# Patient Record
Sex: Male | Born: 1970 | Race: White | Hispanic: No | Marital: Married | Smoking: Never smoker
Health system: Southern US, Community
[De-identification: ages and names within clinical notes are randomized; demographics above are authoritative.]

## PROBLEM LIST (undated history)

## (undated) DIAGNOSIS — I10 Essential (primary) hypertension: Secondary | ICD-10-CM

## (undated) HISTORY — DX: Essential (primary) hypertension: I10

---

## 2021-05-16 ENCOUNTER — Encounter: Payer: Self-pay | Admitting: Internal Medicine

## 2021-05-16 ENCOUNTER — Other Ambulatory Visit: Payer: Self-pay

## 2021-05-16 ENCOUNTER — Ambulatory Visit: Payer: Commercial Managed Care - PPO | Admitting: Internal Medicine

## 2021-05-16 VITALS — BP 108/74 | HR 60 | Temp 98.8°F | Resp 16 | Ht 71.0 in | Wt 258.0 lb

## 2021-05-16 DIAGNOSIS — R0789 Other chest pain: Secondary | ICD-10-CM | POA: Insufficient documentation

## 2021-05-16 DIAGNOSIS — I10 Essential (primary) hypertension: Secondary | ICD-10-CM | POA: Insufficient documentation

## 2021-05-16 DIAGNOSIS — M1A09X0 Idiopathic chronic gout, multiple sites, without tophus (tophi): Secondary | ICD-10-CM | POA: Diagnosis not present

## 2021-05-16 DIAGNOSIS — Z23 Encounter for immunization: Secondary | ICD-10-CM

## 2021-05-16 DIAGNOSIS — Z1159 Encounter for screening for other viral diseases: Secondary | ICD-10-CM

## 2021-05-16 DIAGNOSIS — Z0000 Encounter for general adult medical examination without abnormal findings: Secondary | ICD-10-CM | POA: Diagnosis not present

## 2021-05-16 DIAGNOSIS — K219 Gastro-esophageal reflux disease without esophagitis: Secondary | ICD-10-CM

## 2021-05-16 DIAGNOSIS — Z1211 Encounter for screening for malignant neoplasm of colon: Secondary | ICD-10-CM | POA: Insufficient documentation

## 2021-05-16 DIAGNOSIS — G4733 Obstructive sleep apnea (adult) (pediatric): Secondary | ICD-10-CM | POA: Insufficient documentation

## 2021-05-16 DIAGNOSIS — E669 Obesity, unspecified: Secondary | ICD-10-CM | POA: Insufficient documentation

## 2021-05-16 DIAGNOSIS — R9431 Abnormal electrocardiogram [ECG] [EKG]: Secondary | ICD-10-CM

## 2021-05-16 LAB — CBC WITH DIFFERENTIAL/PLATELET
Basophils Absolute: 0.1 10*3/uL (ref 0.0–0.1)
Basophils Relative: 0.7 % (ref 0.0–3.0)
Eosinophils Absolute: 0.1 10*3/uL (ref 0.0–0.7)
Eosinophils Relative: 1.8 % (ref 0.0–5.0)
HCT: 44 % (ref 39.0–52.0)
Hemoglobin: 15.2 g/dL (ref 13.0–17.0)
Lymphocytes Relative: 32.3 % (ref 12.0–46.0)
Lymphs Abs: 2.6 10*3/uL (ref 0.7–4.0)
MCHC: 34.4 g/dL (ref 30.0–36.0)
MCV: 87.2 fl (ref 78.0–100.0)
Monocytes Absolute: 0.5 10*3/uL (ref 0.1–1.0)
Monocytes Relative: 6.7 % (ref 3.0–12.0)
Neutro Abs: 4.6 10*3/uL (ref 1.4–7.7)
Neutrophils Relative %: 58.5 % (ref 43.0–77.0)
Platelets: 246 10*3/uL (ref 150.0–400.0)
RBC: 5.05 Mil/uL (ref 4.22–5.81)
RDW: 14.3 % (ref 11.5–15.5)
WBC: 7.9 10*3/uL (ref 4.0–10.5)

## 2021-05-16 LAB — HEPATIC FUNCTION PANEL
ALT: 28 U/L (ref 0–53)
AST: 23 U/L (ref 0–37)
Albumin: 4.5 g/dL (ref 3.5–5.2)
Alkaline Phosphatase: 86 U/L (ref 39–117)
Bilirubin, Direct: 0.1 mg/dL (ref 0.0–0.3)
Total Bilirubin: 0.6 mg/dL (ref 0.2–1.2)
Total Protein: 6.7 g/dL (ref 6.0–8.3)

## 2021-05-16 LAB — URINALYSIS, ROUTINE W REFLEX MICROSCOPIC
Bilirubin Urine: NEGATIVE
Hgb urine dipstick: NEGATIVE
Ketones, ur: NEGATIVE
Leukocytes,Ua: NEGATIVE
Nitrite: NEGATIVE
Specific Gravity, Urine: 1.02 (ref 1.000–1.030)
Urine Glucose: NEGATIVE
Urobilinogen, UA: 1 (ref 0.0–1.0)
pH: 6.5 (ref 5.0–8.0)

## 2021-05-16 LAB — BASIC METABOLIC PANEL
BUN: 21 mg/dL (ref 6–23)
CO2: 30 mEq/L (ref 19–32)
Calcium: 9.9 mg/dL (ref 8.4–10.5)
Chloride: 102 mEq/L (ref 96–112)
Creatinine, Ser: 1.05 mg/dL (ref 0.40–1.50)
GFR: 81.88 mL/min (ref >60.00)
Glucose, Bld: 97 mg/dL (ref 70–99)
Potassium: 4.4 mEq/L (ref 3.5–5.1)
Sodium: 140 mEq/L (ref 135–145)

## 2021-05-16 LAB — URIC ACID: Uric Acid, Serum: 5.9 mg/dL (ref 4.0–7.8)

## 2021-05-16 LAB — HEMOGLOBIN A1C: Hgb A1c MFr Bld: 5.9 % (ref 4.6–6.5)

## 2021-05-16 LAB — PSA: PSA: 0.48 ng/mL (ref 0.10–4.00)

## 2021-05-16 LAB — LIPID PANEL
Cholesterol: 128 mg/dL (ref 0–200)
HDL: 40.5 mg/dL (ref >39.00)
LDL Cholesterol: 62 mg/dL (ref 0–99)
NonHDL: 87.35
Total CHOL/HDL Ratio: 3
Triglycerides: 129 mg/dL (ref 0.0–149.0)
VLDL: 25.8 mg/dL (ref 0.0–40.0)

## 2021-05-16 LAB — BRAIN NATRIURETIC PEPTIDE: Pro B Natriuretic peptide (BNP): 21 pg/mL (ref 0.0–100.0)

## 2021-05-16 LAB — TROPONIN I (HIGH SENSITIVITY): High Sens Troponin I: 2 ng/L (ref 2–17)

## 2021-05-16 MED ORDER — BISOPROLOL FUMARATE 10 MG PO TABS
10.0000 mg | ORAL_TABLET | Freq: Every day | ORAL | 0 refills | Status: DC
Start: 2021-05-16 — End: 2021-08-06

## 2021-05-16 NOTE — Patient Instructions (Signed)
Health Maintenance, Male Adopting a healthy lifestyle and getting preventive care are important in promoting health and wellness. Ask your health care provider about: The right schedule for you to have regular tests and exams. Things you can do on your own to prevent diseases and keep yourself healthy. What should I know about diet, weight, and exercise? Eat a healthy diet  Eat a diet that includes plenty of vegetables, fruits, low-fat dairy products, and lean protein. Do not eat a lot of foods that are high in solid fats, added sugars, or sodium.  Maintain a healthy weight Body mass index (BMI) is a measurement that can be used to identify possible weight problems. It estimates body fat based on height and weight. Your health care provider can help determine your BMI and help you achieve or maintain ahealthy weight. Get regular exercise Get regular exercise. This is one of the most important things you can do for your health. Most adults should: Exercise for at least 150 minutes each week. The exercise should increase your heart rate and make you sweat (moderate-intensity exercise). Do strengthening exercises at least twice a week. This is in addition to the moderate-intensity exercise. Spend less time sitting. Even light physical activity can be beneficial. Watch cholesterol and blood lipids Have your blood tested for lipids and cholesterol at 52 years of age, then havethis test every 5 years. You may need to have your cholesterol levels checked more often if: Your lipid or cholesterol levels are high. You are older than 52 years of age. You are at high risk for heart disease. What should I know about cancer screening? Many types of cancers can be detected early and may often be prevented. Depending on your health history and family history, you may need to have cancer screening at various ages. This may include screening for: Colorectal cancer. Prostate cancer. Skin cancer. Lung  cancer. What should I know about heart disease, diabetes, and high blood pressure? Blood pressure and heart disease High blood pressure causes heart disease and increases the risk of stroke. This is more likely to develop in people who have high blood pressure readings, are of African descent, or are overweight. Talk with your health care provider about your target blood pressure readings. Have your blood pressure checked: Every 3-5 years if you are 18-39 years of age. Every year if you are 40 years old or older. If you are between the ages of 65 and 75 and are a current or former smoker, ask your health care provider if you should have a one-time screening for abdominal aortic aneurysm (AAA). Diabetes Have regular diabetes screenings. This checks your fasting blood sugar level. Have the screening done: Once every three years after age 45 if you are at a normal weight and have a low risk for diabetes. More often and at a younger age if you are overweight or have a high risk for diabetes. What should I know about preventing infection? Hepatitis B If you have a higher risk for hepatitis B, you should be screened for this virus. Talk with your health care provider to find out if you are at risk forhepatitis B infection. Hepatitis C Blood testing is recommended for: Everyone born from 1945 through 1965. Anyone with known risk factors for hepatitis C. Sexually transmitted infections (STIs) You should be screened each year for STIs, including gonorrhea and chlamydia, if: You are sexually active and are younger than 52 years of age. You are older than 52 years of age   and your health care provider tells you that you are at risk for this type of infection. Your sexual activity has changed since you were last screened, and you are at increased risk for chlamydia or gonorrhea. Ask your health care provider if you are at risk. Ask your health care provider about whether you are at high risk for HIV.  Your health care provider may recommend a prescription medicine to help prevent HIV infection. If you choose to take medicine to prevent HIV, you should first get tested for HIV. You should then be tested every 3 months for as long as you are taking the medicine. Follow these instructions at home: Lifestyle Do not use any products that contain nicotine or tobacco, such as cigarettes, e-cigarettes, and chewing tobacco. If you need help quitting, ask your health care provider. Do not use street drugs. Do not share needles. Ask your health care provider for help if you need support or information about quitting drugs. Alcohol use Do not drink alcohol if your health care provider tells you not to drink. If you drink alcohol: Limit how much you have to 0-2 drinks a day. Be aware of how much alcohol is in your drink. In the [place] one drink equals one 12 oz bottle of beer (355 mL), one 5 oz glass of wine (148 mL), or one 1 oz glass of hard liquor (44 mL). General instructions Schedule regular health, dental, and eye exams. Stay current with your vaccines. Tell your health care provider if: You often feel depressed. You have ever been abused or do not feel safe at home. Summary Adopting a healthy lifestyle and getting preventive care are important in promoting health and wellness. Follow your health care provider's instructions about healthy diet, exercising, and getting tested or screened for diseases. Follow your health care provider's instructions on monitoring your cholesterol and blood pressure. This information is not intended to replace advice given to you by your health care provider. Make sure you discuss any questions you have with your healthcare provider. Document Revised: 10/15/2018 Document Reviewed: 10/15/2018 Elsevier Patient Education  2022 [employer] Inc[employer]  

## 2021-05-16 NOTE — Progress Notes (Signed)
Subjective:  Patient ID: Kenneth Bradshaw, male    DOB: 1971-05-20  Age: 50 y.o. MRN: [id number]  CC: Annual Exam, Hypertension, and Gastroesophageal Reflux  This visit occurred during the SARS-CoV-2 public health emergency.  Safety protocols were in place, including screening questions prior to the visit, additional usage of staff PPE, and extensive cleaning of exam room while observing appropriate contact time as indicated for disinfecting solutions.    HPI Kenneth Bradshaw presents for a CPX, f/up, and to establish.  He complains of a several month history of diffuse chest tightness that he relates to GERD.  He gets symptom relief with Tums.  He denies odynophagia, dysphagia, loss of appetite, or weight loss.  He does not exercise but when he is active he does not experience CP, DOE, palpitations, edema, or fatigue.  He has a history of hypertension but has not been monitoring his blood pressure recently.   History Kenneth Bradshaw has a past medical history of HTN (hypertension).   He has no past surgical history on file.   His family history includes Arthritis in his daughter; Bell's palsy in his brother; Cancer in his paternal grandfather and paternal grandmother; Diabetes in his father and paternal grandfather; Heart disease in his mother; Hyperlipidemia in his mother; Hypertension in his mother.He reports that he has never smoked. He has never used smokeless tobacco. He reports that he does not drink alcohol and does not use drugs.  Outpatient Medications Prior to Visit  Medication Sig Dispense Refill   allopurinol (ZYLOPRIM) 300 MG tablet Take 300 mg by mouth daily.     bisoprolol-hydrochlorothiazide (ZIAC) 10-6.25 MG tablet Take 1 tablet by mouth daily.     No facility-administered medications prior to visit.    ROS Review of Systems  Constitutional: Negative.  Negative for diaphoresis, fatigue and unexpected weight change.  HENT: Negative.  Negative for sore throat and trouble swallowing.    Eyes: Negative.   Respiratory:  Positive for apnea and chest tightness. Negative for cough, shortness of breath and wheezing.   Cardiovascular:  Negative for chest pain, palpitations and leg swelling.  Gastrointestinal:  Negative for abdominal pain, blood in stool, constipation, diarrhea, nausea and vomiting.       ++ heartburn  Endocrine: Negative.   Genitourinary: Negative.  Negative for difficulty urinating, dysuria, penile swelling, scrotal swelling and urgency.  Musculoskeletal: Negative.   Skin: Negative.   Neurological:  Negative for dizziness, weakness, light-headedness and numbness.  Hematological:  Negative for adenopathy. Does not bruise/bleed easily.  Psychiatric/Behavioral: Negative.     Objective:  BP 108/74 (BP Location: Left Arm, Patient Position: Sitting, Cuff Size: Large) Comment: BP (R) 108/74 (L) 106/72  Pulse 60   Temp 98.8 F (37.1 C) (Oral)   Resp 16   Ht 5\' 11"  (1.803 m)   Wt 258 lb (117 kg)   SpO2 96%   BMI 35.98 kg/m   Physical Exam Vitals reviewed.  Constitutional:      Appearance: He is obese.  HENT:     Nose: Nose normal.     Mouth/Throat:     Mouth: Mucous membranes are moist.  Eyes:     General: No scleral icterus.    Conjunctiva/sclera: Conjunctivae normal.  Cardiovascular:     Rate and Rhythm: Regular rhythm. Bradycardia present.     Heart sounds: Normal heart sounds, S1 normal and S2 normal. No murmur heard.   No friction rub.     Comments: EKG- Sinus bradycardia, 58 bpm No LVH or Q  waves TWI/flattening in III, aVF, V1-V4 Pulmonary:     Effort: Pulmonary effort is normal.     Breath sounds: No stridor. No wheezing, rhonchi or rales.  Abdominal:     General: Abdomen is protuberant. Bowel sounds are normal. There is no distension.     Palpations: Abdomen is soft. There is no hepatomegaly, splenomegaly or mass.     Tenderness: There is no abdominal tenderness. There is no guarding.     Hernia: There is no hernia in the left inguinal  area or right inguinal area.  Genitourinary:    Pubic Area: No rash.      Penis: Normal.      Testes: Normal.     Epididymis:     Right: Normal. No mass.     Left: Normal. No mass.     Prostate: Normal. Not enlarged, not tender and no nodules present.     Rectum: Normal. Guaiac result negative. No mass, tenderness, anal fissure, external hemorrhoid or internal hemorrhoid. Normal anal tone.  Musculoskeletal:     Cervical back: Neck supple.     Right lower leg: No edema.     Left lower leg: No edema.  Lymphadenopathy:     Cervical: No cervical adenopathy.     Lower Body: No right inguinal adenopathy. No left inguinal adenopathy.  Skin:    General: Skin is warm and dry.  Neurological:     General: No focal deficit present.     Mental Status: He is alert and oriented to person, place, and time. Mental status is at baseline.  Psychiatric:        Mood and Affect: Mood normal.        Behavior: Behavior normal.    Lab Results  Component Value Date   WBC 7.9 05/16/2021   HGB 15.2 05/16/2021   HCT 44.0 05/16/2021   PLT 246.0 05/16/2021   GLUCOSE 97 05/16/2021   CHOL 128 05/16/2021   TRIG 129.0 05/16/2021   HDL 40.50 05/16/2021   LDLCALC 62 05/16/2021   ALT 28 05/16/2021   AST 23 05/16/2021   NA 140 05/16/2021   K 4.4 05/16/2021   CL 102 05/16/2021   CREATININE 1.05 05/16/2021   BUN 21 05/16/2021   CO2 30 05/16/2021   PSA 0.48 05/16/2021   HGBA1C 5.9 05/16/2021     Assessment & Plan:   [place]wood was seen today for annual exam, hypertension and gastroesophageal reflux.  Diagnoses and all orders for this visit:  Routine general medical examination at a health care facility- Exam completed, labs reviewed, vaccines reviewed, cancer screenings addressed, patient education was given. -     Lipid panel; Future -     PSA; Future -     Hepatitis C antibody; Future -     HIV Antibody (routine testing w rflx); Future -     HIV Antibody (routine testing w rflx) -     Hepatitis C  antibody -     PSA -     Lipid panel  Need for hepatitis C screening test -     Hepatitis C antibody; Future -     Hepatitis C antibody  Screen for colon cancer -     Cologuard; Future  Primary hypertension- His blood pressure is overcontrolled.  Will discontinue hydrochlorothiazide and maintain the current dose of bisoprolol.  I may taper the dose of bisoprolol over the next few months. -     EKG 12-Lead -     bisoprolol (ZEBETA)  10 MG tablet; Take 1 tablet (10 mg total) by mouth daily. -     CBC with Differential/Platelet; Future -     Basic metabolic panel; Future -     Urinalysis, Routine w reflex microscopic; Future -     Hepatic function panel; Future -     Hepatic function panel -     Urinalysis, Routine w reflex microscopic -     Basic metabolic panel -     CBC with Differential/Platelet  Chest tightness- His EKG is abnormal.  His labs are reassuring.  I have asked him to undergo a CT calcium score to gauge his risk for CAD. -     Troponin I (High Sensitivity); Future -     Brain natriuretic peptide; Future -     D-dimer, quantitative; Future -     D-dimer, quantitative -     Brain natriuretic peptide -     Troponin I (High Sensitivity) -     CT CARDIAC SCORING (SELF PAY ONLY); Future  Idiopathic chronic gout of multiple sites without tophus- He has achieved his uric acid goal. -     Uric acid; Future -     Uric acid  Gastroesophageal reflux disease without esophagitis-he is getting adequate symptom relief with Tums.  He prefers not to start a medication for this. -     CBC with Differential/Platelet; Future -     CBC with Differential/Platelet  OSA (obstructive sleep apnea)- He has decided not to use CPAP.  Obesity (BMI 30.0-34.9)- Labs are negative for secondary causes or complications related to this. -     Thyroid Panel With TSH; Future -     Hemoglobin A1c; Future -     Hepatic function panel; Future -     Hepatic function panel -     Hemoglobin A1c -      Thyroid Panel With TSH  Abnormal electrocardiogram (ECG) (EKG) -     CT CARDIAC SCORING (SELF PAY ONLY); Future  Other orders -     Tdap vaccine greater than or equal to 7yo IM -     Varicella-zoster vaccine IM (Shingrix)  I have discontinued Fayrene Fearing Grizzle's bisoprolol-hydrochlorothiazide. I am also having him start on bisoprolol. Additionally, I am having him maintain his allopurinol.  Meds ordered this encounter  Medications   bisoprolol (ZEBETA) 10 MG tablet    Sig: Take 1 tablet (10 mg total) by mouth daily.    Dispense:  90 tablet    Refill:  0      Follow-up: Return in about 3 months (around 08/16/2021).  Sanda Linger, MD

## 2021-05-17 ENCOUNTER — Encounter: Payer: Self-pay | Admitting: Internal Medicine

## 2021-05-17 LAB — D-DIMER, QUANTITATIVE: D-Dimer, Quant: <0.19 mcg/mL FEU (ref <0.50)

## 2021-05-17 LAB — HEPATITIS C ANTIBODY
Hepatitis C Ab: NONREACTIVE
SIGNAL TO CUT-OFF: 0 (ref <1.00)

## 2021-05-17 LAB — THYROID PANEL WITH TSH
Free Thyroxine Index: 2.2 (ref 1.4–3.8)
T3 Uptake: 27 % (ref 22–35)
T4, Total: 8.3 ug/dL (ref 4.9–10.5)
TSH: 2.08 mIU/L (ref 0.40–4.50)

## 2021-05-17 LAB — HIV ANTIBODY (ROUTINE TESTING W REFLEX): HIV 1&2 Ab, 4th Generation: NONREACTIVE

## 2021-05-20 NOTE — Addendum Note (Signed)
Addended by: Etta Grandchild on: 05/20/2021 10:48 AM   Modules accepted: Orders

## 2021-05-31 ENCOUNTER — Encounter: Payer: Self-pay | Admitting: Internal Medicine

## 2021-06-20 LAB — COLOGUARD: Cologuard: NEGATIVE

## 2021-08-06 ENCOUNTER — Other Ambulatory Visit: Payer: Self-pay | Admitting: Internal Medicine

## 2021-08-06 DIAGNOSIS — I10 Essential (primary) hypertension: Secondary | ICD-10-CM

## 2021-08-16 ENCOUNTER — Ambulatory Visit: Payer: Commercial Managed Care - PPO | Admitting: Internal Medicine

## 2021-08-16 ENCOUNTER — Other Ambulatory Visit: Payer: Self-pay

## 2021-08-16 ENCOUNTER — Encounter: Payer: Self-pay | Admitting: Internal Medicine

## 2021-08-16 VITALS — BP 122/74 | HR 62 | Temp 99.0°F | Ht 71.0 in | Wt 261.0 lb

## 2021-08-16 DIAGNOSIS — Z23 Encounter for immunization: Secondary | ICD-10-CM

## 2021-08-16 DIAGNOSIS — I10 Essential (primary) hypertension: Secondary | ICD-10-CM | POA: Diagnosis not present

## 2021-08-16 NOTE — Patient Instructions (Signed)

## 2021-08-16 NOTE — Progress Notes (Signed)
Subjective:  Patient ID: Kenneth Bradshaw, male    DOB: 1971-03-02  Age: 50 y.o. MRN: [id number]  CC: Hypertension  This visit occurred during the SARS-CoV-2 public health emergency.  Safety protocols were in place, including screening questions prior to the visit, additional usage of staff PPE, and extensive cleaning of exam room while observing appropriate contact time as indicated for disinfecting solutions.    HPI [name] presents for f/up -  He tells me his blood pressure has been well controlled.  He has felt well recently and offers no complaints.  Outpatient Medications Prior to Visit  Medication Sig Dispense Refill   allopurinol (ZYLOPRIM) 300 MG tablet Take 300 mg by mouth daily.     bisoprolol (ZEBETA) 10 MG tablet TAKE 1 TABLET BY MOUTH EVERY DAY 90 tablet 0   No facility-administered medications prior to visit.    ROS Review of Systems  Constitutional:  Negative for diaphoresis and fatigue.  HENT: Negative.    Eyes: Negative.   Respiratory:  Negative for cough, chest tightness, shortness of breath and wheezing.   Cardiovascular:  Negative for chest pain, palpitations and leg swelling.  Gastrointestinal:  Negative for abdominal pain, diarrhea and nausea.  Endocrine: Negative.   Genitourinary: Negative.  Negative for difficulty urinating.  Musculoskeletal: Negative.  Negative for arthralgias.  Skin: Negative.   Neurological: Negative.  Negative for dizziness, weakness and light-headedness.  Hematological:  Negative for adenopathy. Does not bruise/bleed easily.   Objective:  BP 122/74 (BP Location: Left Arm, Patient Position: Sitting, Cuff Size: Large)   Pulse 62   Temp 99 F (37.2 C) (Oral)   Ht 5\' 11"  (1.803 m)   Wt 261 lb (118.4 kg)   SpO2 96%   BMI 36.40 kg/m   BP Readings from Last 3 Encounters:  08/16/21 122/74  05/16/21 108/74    Wt Readings from Last 3 Encounters:  08/16/21 261 lb (118.4 kg)  05/16/21 258 lb (117 kg)    Physical  Exam Vitals reviewed.  HENT:     Nose: Nose normal.     Mouth/Throat:     Mouth: Mucous membranes are moist.  Eyes:     General: No scleral icterus.    Conjunctiva/sclera: Conjunctivae normal.  Cardiovascular:     Rate and Rhythm: Normal rate and regular rhythm.     Heart sounds: No murmur heard. Pulmonary:     Effort: Pulmonary effort is normal.     Breath sounds: No stridor. No wheezing, rhonchi or rales.  Abdominal:     General: Abdomen is flat.     Palpations: There is no mass.     Tenderness: There is no abdominal tenderness. There is no guarding.     Hernia: No hernia is present.  Musculoskeletal:        General: Normal range of motion.     Cervical back: Neck supple.     Right lower leg: No edema.     Left lower leg: No edema.  Skin:    General: Skin is warm and dry.  Neurological:     General: No focal deficit present.     Mental Status: He is alert.    Lab Results  Component Value Date   WBC 7.9 05/16/2021   HGB 15.2 05/16/2021   HCT 44.0 05/16/2021   PLT 246.0 05/16/2021   GLUCOSE 97 05/16/2021   CHOL 128 05/16/2021   TRIG 129.0 05/16/2021   HDL 40.50 05/16/2021   LDLCALC 62 05/16/2021   ALT 28  05/16/2021   AST 23 05/16/2021   NA 140 05/16/2021   K 4.4 05/16/2021   CL 102 05/16/2021   CREATININE 1.05 05/16/2021   BUN 21 05/16/2021   CO2 30 05/16/2021   TSH 2.08 05/16/2021   PSA 0.48 05/16/2021   HGBA1C 5.9 05/16/2021    Patient was never admitted.  Assessment & Plan:   Jaquawn was seen today for hypertension.  Diagnoses and all orders for this visit:  Primary hypertension- His blood pressure is well controlled.  Will continue the current dose of bisoprolol.  Other orders -     Varicella-zoster vaccine IM (Shingrix)  I am having Kenneth Bradshaw Every maintain his allopurinol and bisoprolol.  No orders of the defined types were placed in this encounter.    Follow-up: Return in about 6 months (around 02/14/2022).  Sanda Linger, MD

## 2021-11-03 ENCOUNTER — Other Ambulatory Visit: Payer: Self-pay | Admitting: Internal Medicine

## 2021-11-03 DIAGNOSIS — I10 Essential (primary) hypertension: Secondary | ICD-10-CM

## 2022-01-18 ENCOUNTER — Other Ambulatory Visit: Payer: Self-pay

## 2022-01-18 ENCOUNTER — Encounter: Payer: Self-pay | Admitting: Family Medicine

## 2022-01-18 ENCOUNTER — Telehealth: Payer: Commercial Managed Care - PPO | Admitting: Family Medicine

## 2022-01-18 ENCOUNTER — Telehealth (INDEPENDENT_AMBULATORY_CARE_PROVIDER_SITE_OTHER): Payer: Commercial Managed Care - PPO | Admitting: Family Medicine

## 2022-01-18 DIAGNOSIS — R051 Acute cough: Secondary | ICD-10-CM | POA: Diagnosis not present

## 2022-01-18 DIAGNOSIS — J029 Acute pharyngitis, unspecified: Secondary | ICD-10-CM | POA: Diagnosis not present

## 2022-01-18 DIAGNOSIS — U071 COVID-19: Secondary | ICD-10-CM

## 2022-01-18 MED ORDER — BENZONATATE 200 MG PO CAPS: 200.0000 mg | ORAL_CAPSULE | Freq: Two times a day (BID) | ORAL | 0 refills | Status: DC | PRN

## 2022-01-18 NOTE — Progress Notes (Signed)
? ?  Subjective:  ? ? Patient ID: Kenneth Bradshaw, male    DOB: 17-Jan-1971, 51 y.o.   MRN: [id number] ? ?HPI ?No chief complaint on file. ? ?Documentation for virtual audio and video telecommunications through [place] encounter: ? ?The patient was located at home. 2 patient identifiers used.  ?The provider was located in the office. ?The patient did consent to this visit and is aware of possible charges through their insurance for this visit. ? ?The other persons participating in this telemedicine service were his wife. ?Time spent on call was 13 minutes and in review of previous records >15 minutes total. ? ?This virtual service is not related to other E/M service within previous 7 days. ? ?Complains of a 5 day history of fever, chills, cough, nasal congestion, sore throat,  ?Cough worse at night.  No wheezing, chest tightness or congestion. ?Positive Covid test today.  ?States he is feeling significantly improved and main concern is cough. ?Denies fever currently. ? ?Denies dizziness, chest pain, palpitations, shortness of breath, abdominal pain, N/V/D. ? ? ?Taking Nyquil at night, Vics Dayquil during the day ? ? ?Does not smoke, immunocompetent.  ? ?He has had COVID vaccines including bivalent booster. ? ?Reviewed allergies, medications, past medical, surgical, family, and social history. ? ? ? ? ?Review of Systems ?Pertinent positives and negatives in the history of present illness. ? ?   ?Objective:  ? Physical Exam ?There were no vitals taken for this visit. ? ?Alert and oriented in no acute distress.  Respirations unlabored.  Speaking in complete sentences without difficulty.  Normal mood. ? ? ?   ?Assessment & Plan:  ?COVID-19 virus infection ? ?Acute pharyngitis, unspecified etiology ? ?Acute cough ? ?Positive COVID test with symptom onset 5 days ago.  Reports feeling significantly improved and declines antiviral after discussion.  Counseling on symptomatic management including adding ibuprofen as needed, salt  water gargles, throat lozenges and good hydration.  Tessalon Perles prescribed.  Continue multisymptom cold medication as needed.  Discussed quarantine guidelines. he will follow-up if worsening. ? ?

## 2022-01-29 ENCOUNTER — Other Ambulatory Visit: Payer: Self-pay | Admitting: Internal Medicine

## 2022-01-29 DIAGNOSIS — I10 Essential (primary) hypertension: Secondary | ICD-10-CM

## 2022-02-14 ENCOUNTER — Encounter: Payer: Self-pay | Admitting: Internal Medicine

## 2022-02-14 ENCOUNTER — Ambulatory Visit (INDEPENDENT_AMBULATORY_CARE_PROVIDER_SITE_OTHER): Payer: Commercial Managed Care - PPO

## 2022-02-14 ENCOUNTER — Ambulatory Visit: Payer: Commercial Managed Care - PPO | Admitting: Internal Medicine

## 2022-02-14 VITALS — BP 126/84 | HR 62 | Temp 98.4°F | Resp 16 | Ht 71.0 in | Wt 254.0 lb

## 2022-02-14 DIAGNOSIS — J22 Unspecified acute lower respiratory infection: Secondary | ICD-10-CM | POA: Diagnosis not present

## 2022-02-14 DIAGNOSIS — R058 Other specified cough: Secondary | ICD-10-CM

## 2022-02-14 DIAGNOSIS — M1A00X0 Idiopathic chronic gout, unspecified site, without tophus (tophi): Secondary | ICD-10-CM | POA: Diagnosis not present

## 2022-02-14 MED ORDER — ALLOPURINOL 300 MG PO TABS: 300.0000 mg | ORAL_TABLET | Freq: Every day | ORAL | 0 refills | Status: DC

## 2022-02-14 MED ORDER — CEFDINIR 300 MG PO CAPS: 300.0000 mg | ORAL_CAPSULE | Freq: Two times a day (BID) | ORAL | 0 refills | Status: AC

## 2022-02-14 NOTE — Patient Instructions (Signed)

## 2022-02-14 NOTE — Progress Notes (Signed)
? ?Subjective:  ?Patient ID: Kenneth Bradshaw, male    DOB: 12-10-1970  Age: 51 y.o. MRN: [id number] ? ?CC: Cough ? ? ?HPI ?Sandro Burgo presents for f/up -  ? ?He was diagnosed with COVID about 6 weeks ago. His cough is improving but persists and is intermittently productive of yellow phlegm. ? ?Outpatient Medications Prior to Visit  ?Medication Sig Dispense Refill  ? bisoprolol (ZEBETA) 10 MG tablet TAKE 1 TABLET BY MOUTH EVERY DAY 90 tablet 0  ? allopurinol (ZYLOPRIM) 300 MG tablet Take 300 mg by mouth daily.    ? benzonatate (TESSALON) 200 MG capsule Take 1 capsule (200 mg total) by mouth 2 (two) times daily as needed for cough. 20 capsule 0  ? ?No facility-administered medications prior to visit.  ? ? ?ROS ?Review of Systems  ?Constitutional:  Negative for chills, diaphoresis, fatigue and fever.  ?HENT: Negative.  Negative for sinus pressure, sore throat and trouble swallowing.   ?Respiratory:  Positive for cough. Negative for chest tightness, shortness of breath and wheezing.   ?Cardiovascular:  Negative for chest pain, palpitations and leg swelling.  ?Gastrointestinal:  Negative for abdominal pain, diarrhea and nausea.  ?Genitourinary: Negative.  Negative for penile pain and penile swelling.  ?Musculoskeletal: Negative.   ?Skin: Negative.  Negative for rash.  ?Neurological:  Negative for dizziness, weakness and headaches.  ?Hematological:  Negative for adenopathy. Does not bruise/bleed easily.  ?Psychiatric/Behavioral: Negative.    ? ?Objective:  ?BP 126/84 (BP Location: Right Arm, Patient Position: Sitting, Cuff Size: Large)   Pulse 62   Temp 98.4 ?F (36.9 ?C) (Oral)   Resp 16   Ht 5\' 11"  (1.803 m)   Wt 254 lb (115.2 kg)   SpO2 95%   BMI 35.43 kg/m?  ? ?BP Readings from Last 3 Encounters:  ?02/14/22 126/84  ?08/16/21 122/74  ?05/16/21 108/74  ? ? ?Wt Readings from Last 3 Encounters:  ?02/14/22 254 lb (115.2 kg)  ?08/16/21 261 lb (118.4 kg)  ?05/16/21 258 lb (117 kg)  ? ? ?Physical Exam ?Vitals reviewed.   ?Constitutional:   ?   Appearance: He is not ill-appearing.  ?HENT:  ?   Nose: Nose normal.  ?   Mouth/Throat:  ?   Mouth: Mucous membranes are moist.  ?Eyes:  ?   General: No scleral icterus. ?   Conjunctiva/sclera: Conjunctivae normal.  ?Cardiovascular:  ?   Rate and Rhythm: Normal rate and regular rhythm.  ?   Heart sounds: No murmur heard. ?  No friction rub. No gallop.  ?Pulmonary:  ?   Effort: Pulmonary effort is normal. No respiratory distress.  ?   Breath sounds: No stridor. No wheezing, rhonchi or rales.  ?Chest:  ?   Chest wall: No tenderness.  ?Abdominal:  ?   General: Abdomen is protuberant. Bowel sounds are normal. There is no distension.  ?   Palpations: Abdomen is soft. There is no hepatomegaly, splenomegaly or mass.  ?   Tenderness: There is no abdominal tenderness. There is no guarding.  ?Musculoskeletal:     ?   General: Normal range of motion.  ?   Cervical back: Neck supple.  ?   Right lower leg: No edema.  ?   Left lower leg: No edema.  ?Lymphadenopathy:  ?   Cervical: No cervical adenopathy.  ?Skin: ?   General: Skin is warm.  ?   Findings: No lesion or rash.  ?Neurological:  ?   General: No focal deficit present.  ?  Mental Status: He is alert.  ?Psychiatric:     ?   Mood and Affect: Mood normal.     ?   Behavior: Behavior normal.  ? ? ?Lab Results  ?Component Value Date  ? WBC 7.9 05/16/2021  ? HGB 15.2 05/16/2021  ? HCT 44.0 05/16/2021  ? PLT 246.0 05/16/2021  ? GLUCOSE 97 05/16/2021  ? CHOL 128 05/16/2021  ? TRIG 129.0 05/16/2021  ? HDL 40.50 05/16/2021  ? LDLCALC 62 05/16/2021  ? ALT 28 05/16/2021  ? AST 23 05/16/2021  ? NA 140 05/16/2021  ? K 4.4 05/16/2021  ? CL 102 05/16/2021  ? CREATININE 1.05 05/16/2021  ? BUN 21 05/16/2021  ? CO2 30 05/16/2021  ? TSH 2.08 05/16/2021  ? PSA 0.48 05/16/2021  ? HGBA1C 5.9 05/16/2021  ? ? ?DG Chest 2 View ? ?Result Date: 02/14/2022 ?CLINICAL DATA:  51 year old male with cough for 6 weeks. EXAM: CHEST - 2 VIEW COMPARISON:  None. FINDINGS: The mediastinal  contours are within normal limits. No cardiomegaly. The lungs are clear bilaterally without evidence of focal consolidation, pleural effusion, or pneumothorax. No acute osseous abnormality. IMPRESSION: No acute cardiopulmonary process. Electronically Signed   By: Marliss Coots M.D.   On: 02/14/2022 10:40    ? ?Assessment & Plan:  ? ?Karston was seen today for cough. ? ?Diagnoses and all orders for this visit: ? ?Cough productive of yellow sputum- Chest x-ray is negative for mass or infiltrate. ?-     DG Chest 2 View; Future ? ?Idiopathic chronic gout without tophus, unspecified site ?-     allopurinol (ZYLOPRIM) 300 MG tablet; Take 1 tablet (300 mg total) by mouth daily. ? ?LRTI (lower respiratory tract infection)- I am concerned there may be a bacterial cause.  I recommended that he take a 10-day course of cefdinir. ?-     cefdinir (OMNICEF) 300 MG capsule; Take 1 capsule (300 mg total) by mouth 2 (two) times daily for 10 days. ? ? ?I have discontinued Fayrene Fearing Archila's benzonatate. I have also changed his allopurinol. Additionally, I am having him start on cefdinir. Lastly, I am having him maintain his bisoprolol. ? ?Meds ordered this encounter  ?Medications  ? allopurinol (ZYLOPRIM) 300 MG tablet  ?  Sig: Take 1 tablet (300 mg total) by mouth daily.  ?  Dispense:  90 tablet  ?  Refill:  0  ? cefdinir (OMNICEF) 300 MG capsule  ?  Sig: Take 1 capsule (300 mg total) by mouth 2 (two) times daily for 10 days.  ?  Dispense:  20 capsule  ?  Refill:  0  ? ? ? ?Follow-up: Return in about 3 months (around 05/16/2022). ? ?Sanda Linger, MD ?

## 2022-05-01 ENCOUNTER — Other Ambulatory Visit: Payer: Self-pay | Admitting: Internal Medicine

## 2022-05-01 DIAGNOSIS — I10 Essential (primary) hypertension: Secondary | ICD-10-CM

## 2022-05-12 ENCOUNTER — Other Ambulatory Visit: Payer: Self-pay | Admitting: Internal Medicine

## 2022-05-12 DIAGNOSIS — I10 Essential (primary) hypertension: Secondary | ICD-10-CM

## 2022-05-14 ENCOUNTER — Other Ambulatory Visit: Payer: Self-pay | Admitting: Internal Medicine

## 2022-05-16 ENCOUNTER — Ambulatory Visit: Payer: Commercial Managed Care - PPO | Admitting: Internal Medicine

## 2022-05-16 ENCOUNTER — Encounter: Payer: Self-pay | Admitting: Internal Medicine

## 2022-05-16 VITALS — BP 118/80 | HR 56 | Temp 98.0°F | Ht 71.0 in | Wt 254.0 lb

## 2022-05-16 DIAGNOSIS — M1A09X0 Idiopathic chronic gout, multiple sites, without tophus (tophi): Secondary | ICD-10-CM | POA: Diagnosis not present

## 2022-05-16 DIAGNOSIS — Z0000 Encounter for general adult medical examination without abnormal findings: Secondary | ICD-10-CM | POA: Diagnosis not present

## 2022-05-16 DIAGNOSIS — K219 Gastro-esophageal reflux disease without esophagitis: Secondary | ICD-10-CM | POA: Diagnosis not present

## 2022-05-16 DIAGNOSIS — I10 Essential (primary) hypertension: Secondary | ICD-10-CM

## 2022-05-16 LAB — LIPID PANEL
Cholesterol: 138 mg/dL (ref 0–200)
HDL: 45.8 mg/dL (ref >39.00)
LDL Cholesterol: 66 mg/dL (ref 0–99)
NonHDL: 91.82
Total CHOL/HDL Ratio: 3
Triglycerides: 127 mg/dL (ref 0.0–149.0)
VLDL: 25.4 mg/dL (ref 0.0–40.0)

## 2022-05-16 LAB — CBC WITH DIFFERENTIAL/PLATELET
Basophils Absolute: 0.1 10*3/uL (ref 0.0–0.1)
Basophils Relative: 0.6 % (ref 0.0–3.0)
Eosinophils Absolute: 0.1 10*3/uL (ref 0.0–0.7)
Eosinophils Relative: 1.4 % (ref 0.0–5.0)
HCT: 45.4 % (ref 39.0–52.0)
Hemoglobin: 15.1 g/dL (ref 13.0–17.0)
Lymphocytes Relative: 33.7 % (ref 12.0–46.0)
Lymphs Abs: 2.9 10*3/uL (ref 0.7–4.0)
MCHC: 33.2 g/dL (ref 30.0–36.0)
MCV: 88.3 fl (ref 78.0–100.0)
Monocytes Absolute: 0.6 10*3/uL (ref 0.1–1.0)
Monocytes Relative: 6.6 % (ref 3.0–12.0)
Neutro Abs: 4.9 10*3/uL (ref 1.4–7.7)
Neutrophils Relative %: 57.7 % (ref 43.0–77.0)
Platelets: 251 10*3/uL (ref 150.0–400.0)
RBC: 5.14 Mil/uL (ref 4.22–5.81)
RDW: 14.2 % (ref 11.5–15.5)
WBC: 8.6 10*3/uL (ref 4.0–10.5)

## 2022-05-16 LAB — URINALYSIS, ROUTINE W REFLEX MICROSCOPIC
Bilirubin Urine: NEGATIVE
Hgb urine dipstick: NEGATIVE
Ketones, ur: NEGATIVE
Leukocytes,Ua: NEGATIVE
Nitrite: NEGATIVE
RBC / HPF: NONE SEEN (ref 0–?)
Specific Gravity, Urine: 1.025 (ref 1.000–1.030)
Total Protein, Urine: NEGATIVE
Urine Glucose: NEGATIVE
Urobilinogen, UA: 0.2 (ref 0.0–1.0)
pH: 6 (ref 5.0–8.0)

## 2022-05-16 LAB — PSA: PSA: 0.5 ng/mL (ref 0.10–4.00)

## 2022-05-16 LAB — BASIC METABOLIC PANEL
BUN: 17 mg/dL (ref 6–23)
CO2: 30 mEq/L (ref 19–32)
Calcium: 10.2 mg/dL (ref 8.4–10.5)
Chloride: 102 mEq/L (ref 96–112)
Creatinine, Ser: 1.08 mg/dL (ref 0.40–1.50)
GFR: 78.6 mL/min (ref >60.00)
Glucose, Bld: 93 mg/dL (ref 70–99)
Potassium: 4.5 mEq/L (ref 3.5–5.1)
Sodium: 140 mEq/L (ref 135–145)

## 2022-05-16 LAB — HEPATIC FUNCTION PANEL
ALT: 23 U/L (ref 0–53)
AST: 22 U/L (ref 0–37)
Albumin: 4.7 g/dL (ref 3.5–5.2)
Alkaline Phosphatase: 95 U/L (ref 39–117)
Bilirubin, Direct: 0.1 mg/dL (ref 0.0–0.3)
Total Bilirubin: 0.6 mg/dL (ref 0.2–1.2)
Total Protein: 6.9 g/dL (ref 6.0–8.3)

## 2022-05-16 LAB — URIC ACID: Uric Acid, Serum: 6 mg/dL (ref 4.0–7.8)

## 2022-05-16 LAB — TSH: TSH: 2.85 u[IU]/mL (ref 0.35–5.50)

## 2022-05-16 MED ORDER — BISOPROLOL FUMARATE 5 MG PO TABS: 5.0000 mg | ORAL_TABLET | Freq: Every day | ORAL | 1 refills | Status: DC

## 2022-05-16 NOTE — Progress Notes (Signed)
Subjective:  Patient ID: Kenneth Bradshaw, male    DOB: June 22, 1971  Age: 51 y.o. MRN: [id number]  CC: Annual Exam, Hypertension, and Gastroesophageal Reflux   HPI Kenneth Bradshaw presents for a CPX and f/up -  He is active and denies chest pain, shortness of breath, diaphoresis, dizziness, lightheadedness, near-syncope, or palpitations.  Outpatient Medications Prior to Visit  Medication Sig Dispense Refill   allopurinol (ZYLOPRIM) 300 MG tablet Take 1 tablet (300 mg total) by mouth daily. 90 tablet 0   bisoprolol (ZEBETA) 10 MG tablet TAKE 1 TABLET BY MOUTH EVERY DAY 90 tablet 0   No facility-administered medications prior to visit.    ROS Review of Systems  Constitutional: Negative.  Negative for diaphoresis and fatigue.  HENT: Negative.    Eyes: Negative.   Respiratory:  Negative for cough, chest tightness, shortness of breath and wheezing.   Cardiovascular:  Negative for chest pain, palpitations and leg swelling.  Gastrointestinal:  Negative for abdominal pain, constipation, diarrhea, nausea and vomiting.  Endocrine: Negative.   Genitourinary:  Negative for decreased urine volume and difficulty urinating.  Musculoskeletal: Negative.   Skin: Negative.   Neurological:  Negative for dizziness, weakness and light-headedness.  Hematological:  Negative for adenopathy. Does not bruise/bleed easily.  Psychiatric/Behavioral: Negative.      Objective:  BP 118/80 (BP Location: Right Arm, Patient Position: Sitting, Cuff Size: Large)   Pulse (!) 56   Temp 98 F (36.7 C) (Oral)   Ht 5\' 11"  (1.803 m)   Wt 254 lb (115.2 kg)   SpO2 97%   BMI 35.43 kg/m   BP Readings from Last 3 Encounters:  05/16/22 118/80  02/14/22 126/84  08/16/21 122/74    Wt Readings from Last 3 Encounters:  05/16/22 254 lb (115.2 kg)  02/14/22 254 lb (115.2 kg)  08/16/21 261 lb (118.4 kg)    Physical Exam Vitals reviewed. Exam conducted with a chaperone present.  HENT:     Nose: Nose normal.      Mouth/Throat:     Mouth: Mucous membranes are moist.  Eyes:     General: No scleral icterus.    Conjunctiva/sclera: Conjunctivae normal.  Cardiovascular:     Rate and Rhythm: Regular rhythm. Bradycardia present.     Heart sounds: Normal heart sounds, S1 normal and S2 normal.  Pulmonary:     Effort: Pulmonary effort is normal.     Breath sounds: No stridor. No wheezing, rhonchi or rales.  Abdominal:     Palpations: There is no mass.     Tenderness: There is no abdominal tenderness. There is no guarding or rebound.     Hernia: No hernia is present. There is no hernia in the left inguinal area or right inguinal area.  Genitourinary:    Penis: Normal.      Testes: Normal.     Epididymis:     Right: Normal.     Left: Normal.     Prostate: Normal. Not enlarged, not tender and no nodules present.     Rectum: Normal. Guaiac result negative. No mass, tenderness, anal fissure, external hemorrhoid or internal hemorrhoid. Normal anal tone.  Musculoskeletal:     Cervical back: Neck supple.     Right lower leg: No edema.     Left lower leg: No edema.  Lymphadenopathy:     Lower Body: No right inguinal adenopathy. No left inguinal adenopathy.  Skin:    General: Skin is warm and dry.  Neurological:     General: No  focal deficit present.     Mental Status: He is alert.  Psychiatric:        Mood and Affect: Mood normal.        Behavior: Behavior normal.     Lab Results  Component Value Date   WBC 8.6 05/16/2022   HGB 15.1 05/16/2022   HCT 45.4 05/16/2022   PLT 251.0 05/16/2022   GLUCOSE 93 05/16/2022   CHOL 138 05/16/2022   TRIG 127.0 05/16/2022   HDL 45.80 05/16/2022   LDLCALC 66 05/16/2022   ALT 23 05/16/2022   AST 22 05/16/2022   NA 140 05/16/2022   K 4.5 05/16/2022   CL 102 05/16/2022   CREATININE 1.08 05/16/2022   BUN 17 05/16/2022   CO2 30 05/16/2022   TSH 2.85 05/16/2022   PSA 0.50 05/16/2022   HGBA1C 5.9 05/16/2021    Patient was never admitted.  Assessment &  Plan:   Kenneth Bradshaw was seen today for annual exam, hypertension and gastroesophageal reflux.  Diagnoses and all orders for this visit:  Primary hypertension- His blood pressure is overcontrolled and he is bradycardic.  Will decrease the beta-blocker dose by 50%. -     TSH; Future -     Urinalysis, Routine w reflex microscopic; Future -     Hepatic function panel; Future -     CBC with Differential/Platelet; Future -     Basic metabolic panel; Future -     bisoprolol (ZEBETA) 5 MG tablet; Take 1 tablet (5 mg total) by mouth daily. -     Basic metabolic panel -     CBC with Differential/Platelet -     Hepatic function panel -     Urinalysis, Routine w reflex microscopic -     TSH  Gastroesophageal reflux disease without esophagitis- He has no symptoms related to this. -     CBC with Differential/Platelet; Future -     CBC with Differential/Platelet  Idiopathic chronic gout of multiple sites without tophus- He has achieved his uric acid goal and has had no recent gout attacks. -     Uric acid; Future -     Uric acid  Routine general medical examination at a health care facility- Exam completed, labs reviewed, vaccines are up-to-date, cancer screenings are up-to-date, patient education was given. -     Lipid panel; Future -     PSA; Future -     PSA -     Lipid panel   I have discontinued [name]'s bisoprolol. I am also having him start on bisoprolol. Additionally, I am having him maintain his allopurinol.  Meds ordered this encounter  Medications   bisoprolol (ZEBETA) 5 MG tablet    Sig: Take 1 tablet (5 mg total) by mouth daily.    Dispense:  90 tablet    Refill:  1     Follow-up: Return in about 6 months (around 11/16/2022).  Sanda Linger, MD

## 2022-05-16 NOTE — Patient Instructions (Signed)
Health Maintenance, Male Adopting a healthy lifestyle and getting preventive care are important in promoting health and wellness. Ask your health care provider about: The right schedule for you to have regular tests and exams. Things you can do on your own to prevent diseases and keep yourself healthy. What should I know about diet, weight, and exercise? Eat a healthy diet  Eat a diet that includes plenty of vegetables, fruits, low-fat dairy products, and lean protein. Do not eat a lot of foods that are high in solid fats, added sugars, or sodium. Maintain a healthy weight Body mass index (BMI) is a measurement that can be used to identify possible weight problems. It estimates body fat based on height and weight. Your health care provider can help determine your BMI and help you achieve or maintain a healthy weight. Get regular exercise Get regular exercise. This is one of the most important things you can do for your health. Most adults should: Exercise for at least 150 minutes each week. The exercise should increase your heart rate and make you sweat (moderate-intensity exercise). Do strengthening exercises at least twice a week. This is in addition to the moderate-intensity exercise. Spend less time sitting. Even light physical activity can be beneficial. Watch cholesterol and blood lipids Have your blood tested for lipids and cholesterol at 53 years of age, then have this test every 5 years. You may need to have your cholesterol levels checked more often if: Your lipid or cholesterol levels are high. You are older than 53 years of age. You are at high risk for heart disease. What should I know about cancer screening? Many types of cancers can be detected early and may often be prevented. Depending on your health history and family history, you may need to have cancer screening at various ages. This may include screening for: Colorectal cancer. Prostate cancer. Skin cancer. Lung  cancer. What should I know about heart disease, diabetes, and high blood pressure? Blood pressure and heart disease High blood pressure causes heart disease and increases the risk of stroke. This is more likely to develop in people who have high blood pressure readings or are overweight. Talk with your health care provider about your target blood pressure readings. Have your blood pressure checked: Every 3-5 years if you are 18-39 years of age. Every year if you are 40 years old or older. If you are between the ages of 65 and 75 and are a current or former smoker, ask your health care provider if you should have a one-time screening for abdominal aortic aneurysm (AAA). Diabetes Have regular diabetes screenings. This checks your fasting blood sugar level. Have the screening done: Once every three years after age 45 if you are at a normal weight and have a low risk for diabetes. More often and at a younger age if you are overweight or have a high risk for diabetes. What should I know about preventing infection? Hepatitis B If you have a higher risk for hepatitis B, you should be screened for this virus. Talk with your health care provider to find out if you are at risk for hepatitis B infection. Hepatitis C Blood testing is recommended for: Everyone born from 1945 through 1965. Anyone with known risk factors for hepatitis C. Sexually transmitted infections (STIs) You should be screened each year for STIs, including gonorrhea and chlamydia, if: You are sexually active and are younger than 53 years of age. You are older than 53 years of age and your   health care provider tells you that you are at risk for this type of infection. Your sexual activity has changed since you were last screened, and you are at increased risk for chlamydia or gonorrhea. Ask your health care provider if you are at risk. Ask your health care provider about whether you are at high risk for HIV. Your health care provider  may recommend a prescription medicine to help prevent HIV infection. If you choose to take medicine to prevent HIV, you should first get tested for HIV. You should then be tested every 3 months for as long as you are taking the medicine. Follow these instructions at home: Alcohol use Do not drink alcohol if your health care provider tells you not to drink. If you drink alcohol: Limit how much you have to 0-2 drinks a day. Know how much alcohol is in your drink. In the [place] one drink equals one 12 oz bottle of beer (355 mL), one 5 oz glass of wine (148 mL), or one 1 oz glass of hard liquor (44 mL). Lifestyle Do not use any products that contain nicotine or tobacco. These products include cigarettes, chewing tobacco, and vaping devices, such as e-cigarettes. If you need help quitting, ask your health care provider. Do not use street drugs. Do not share needles. Ask your health care provider for help if you need support or information about quitting drugs. General instructions Schedule regular health, dental, and eye exams. Stay current with your vaccines. Tell your health care provider if: You often feel depressed. You have ever been abused or do not feel safe at home. Summary Adopting a healthy lifestyle and getting preventive care are important in promoting health and wellness. Follow your health care provider's instructions about healthy diet, exercising, and getting tested or screened for diseases. Follow your health care provider's instructions on monitoring your cholesterol and blood pressure. This information is not intended to replace advice given to you by your health care provider. Make sure you discuss any questions you have with your health care provider. Document Revised: 03/13/2021 Document Reviewed: 03/13/2021 Elsevier Patient Education  2023 [employer]  

## 2022-07-17 ENCOUNTER — Other Ambulatory Visit: Payer: Self-pay | Admitting: Internal Medicine

## 2022-07-17 DIAGNOSIS — M1A00X0 Idiopathic chronic gout, unspecified site, without tophus (tophi): Secondary | ICD-10-CM

## 2022-08-06 ENCOUNTER — Other Ambulatory Visit: Payer: Self-pay | Admitting: Internal Medicine

## 2022-08-06 DIAGNOSIS — M1A00X0 Idiopathic chronic gout, unspecified site, without tophus (tophi): Secondary | ICD-10-CM

## 2022-08-06 DIAGNOSIS — I10 Essential (primary) hypertension: Secondary | ICD-10-CM

## 2022-08-06 MED ORDER — ALLOPURINOL 300 MG PO TABS: 300.0000 mg | ORAL_TABLET | Freq: Every day | ORAL | 0 refills | Status: DC

## 2022-09-17 ENCOUNTER — Other Ambulatory Visit: Payer: Self-pay | Admitting: Internal Medicine

## 2022-09-18 ENCOUNTER — Encounter (INDEPENDENT_AMBULATORY_CARE_PROVIDER_SITE_OTHER): Payer: Self-pay

## 2022-11-03 ENCOUNTER — Other Ambulatory Visit: Payer: Self-pay | Admitting: Internal Medicine

## 2022-11-03 DIAGNOSIS — I10 Essential (primary) hypertension: Secondary | ICD-10-CM

## 2023-01-09 ENCOUNTER — Other Ambulatory Visit: Payer: Self-pay | Admitting: Internal Medicine

## 2023-01-09 DIAGNOSIS — M1A00X0 Idiopathic chronic gout, unspecified site, without tophus (tophi): Secondary | ICD-10-CM

## 2023-01-18 ENCOUNTER — Ambulatory Visit: Payer: Commercial Managed Care - PPO | Admitting: Family Medicine

## 2023-01-18 ENCOUNTER — Encounter: Payer: Self-pay | Admitting: Family Medicine

## 2023-01-18 VITALS — BP 110/76 | HR 60 | Temp 97.6°F | Ht 71.0 in | Wt 220.0 lb

## 2023-01-18 DIAGNOSIS — R42 Dizziness and giddiness: Secondary | ICD-10-CM

## 2023-01-18 DIAGNOSIS — R634 Abnormal weight loss: Secondary | ICD-10-CM

## 2023-01-18 DIAGNOSIS — K5900 Constipation, unspecified: Secondary | ICD-10-CM

## 2023-01-18 DIAGNOSIS — I10 Essential (primary) hypertension: Secondary | ICD-10-CM | POA: Diagnosis not present

## 2023-01-18 NOTE — Progress Notes (Unsigned)
   Subjective:     Patient ID: Kenneth Bradshaw, male    DOB: 04/19/1971, 52 y.o.   MRN: [id number]  Chief Complaint  Patient presents with   Hypertension    Wants to discuss getting off BP medication    HPI Patient is in today for concern regarding lightheadedness.   Health Maintenance Due  Topic Date Due   INFLUENZA VACCINE  06/05/2022   COVID-19 Vaccine (5 - 2023-24 season) 07/06/2022    Past Medical History:  Diagnosis Date   HTN (hypertension)     History reviewed. No pertinent surgical history.  Family History  Problem Relation Age of Onset   Hypertension Mother    Hyperlipidemia Mother    Heart disease Mother    Diabetes Father    Bell's palsy Brother    Arthritis Daughter    Cancer Paternal Grandmother    Diabetes Paternal Grandfather    Cancer Paternal Grandfather     Social History   Socioeconomic History   Marital status: Married    Spouse name: Not on file   Number of children: Not on file   Years of education: Not on file   Highest education level: Not on file  Occupational History   Not on file  Tobacco Use   Smoking status: Never   Smokeless tobacco: Never  Substance and Sexual Activity   Alcohol use: Never   Drug use: Never   Sexual activity: Not on file  Other Topics Concern   Not on file  Social History Narrative   Not on file   Social Determinants of Health   Financial Resource Strain: Not on file  Food Insecurity: Not on file  Transportation Needs: Not on file  Physical Activity: Not on file  Stress: Not on file  Social Connections: Not on file  Intimate Partner Violence: Not on file    Outpatient Medications Prior to Visit  Medication Sig Dispense Refill   allopurinol (ZYLOPRIM) 300 MG tablet TAKE 1 TABLET BY MOUTH EVERY DAY 90 tablet 0   bisoprolol (ZEBETA) 5 MG tablet TAKE 1 TABLET (5 MG TOTAL) BY MOUTH DAILY. 90 tablet 1   No facility-administered medications prior to visit.    No Known Allergies  ROS      Objective:    Physical Exam  BP 110/76 (BP Location: Left Arm, Patient Position: Sitting, Cuff Size: Large)   Pulse 60   Temp 97.6 F (36.4 C) (Temporal)   Ht 5\' 11"  (1.803 m)   Wt 220 lb (99.8 kg)   SpO2 99%   BMI 30.68 kg/m  Wt Readings from Last 3 Encounters:  01/18/23 220 lb (99.8 kg)  05/16/22 254 lb (115.2 kg)  02/14/22 254 lb (115.2 kg)       Assessment & Plan:   Problem List Items Addressed This Visit       Cardiovascular and Mediastinum   Primary hypertension - Primary   Other Visit Diagnoses     Recent weight loss       Intermittent lightheadedness       Constipation, unspecified constipation type           I am having [name] maintain his bisoprolol and allopurinol.  No orders of the defined types were placed in this encounter.

## 2023-01-18 NOTE — Patient Instructions (Addendum)
Cut the bisoprolol 5 mg tablet in half, 2.5 mg daily for the next 7 days and then you can stop the medication.   You may try Miralax 1 to 2 capfuls daily for looser stools. Titrate as needed.   Follow up if your lightheadedness is not improving.

## 2023-04-08 ENCOUNTER — Other Ambulatory Visit: Payer: Self-pay | Admitting: Internal Medicine

## 2023-04-08 DIAGNOSIS — M1A00X0 Idiopathic chronic gout, unspecified site, without tophus (tophi): Secondary | ICD-10-CM

## 2023-05-28 ENCOUNTER — Encounter: Payer: Commercial Managed Care - PPO | Admitting: Internal Medicine

## 2023-07-10 ENCOUNTER — Other Ambulatory Visit: Payer: Self-pay | Admitting: Internal Medicine

## 2023-07-10 DIAGNOSIS — M1A00X0 Idiopathic chronic gout, unspecified site, without tophus (tophi): Secondary | ICD-10-CM

## 2023-07-21 ENCOUNTER — Other Ambulatory Visit: Payer: Self-pay | Admitting: Internal Medicine

## 2024-03-15 IMAGING — DX DG CHEST 2V
2 series · 2 of 2 positions shown · non-contrast
Comparison: None.

CLINICAL DATA: 50-year-old male with cough for 6 weeks.

EXAM:
CHEST - 2 VIEW

[chest pa]
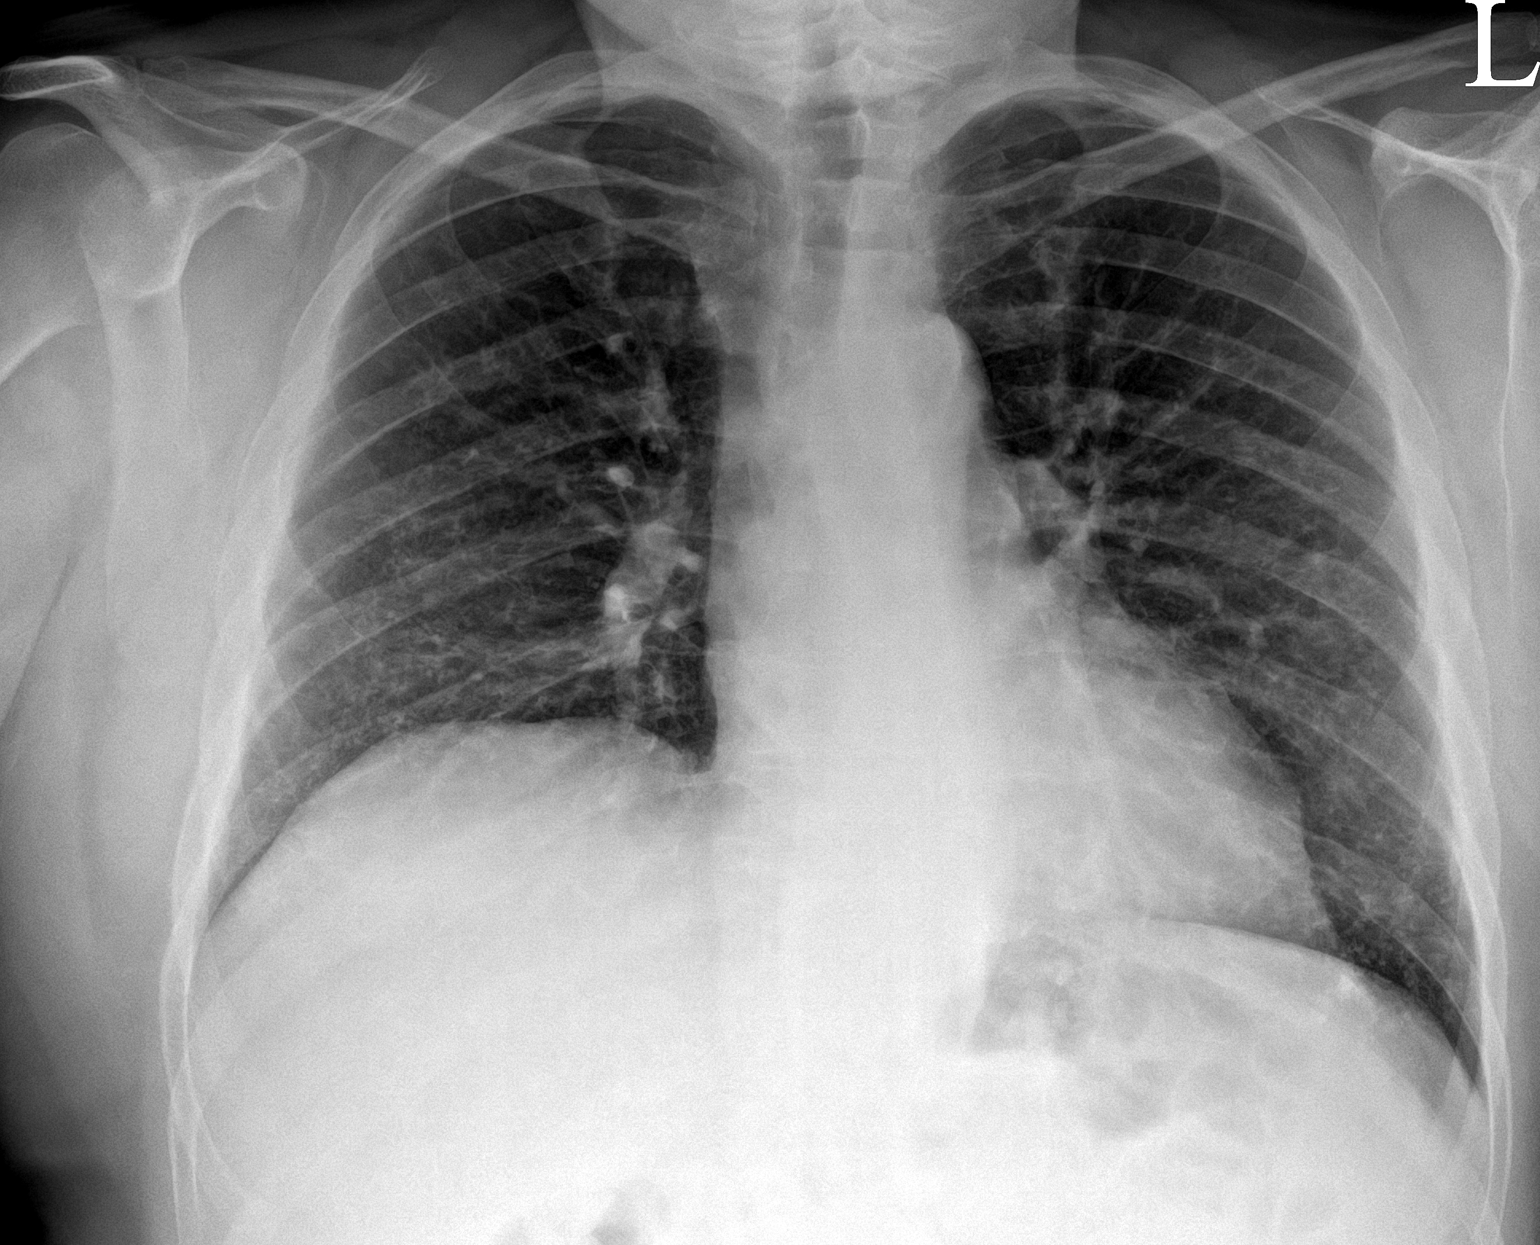

[chest lat]
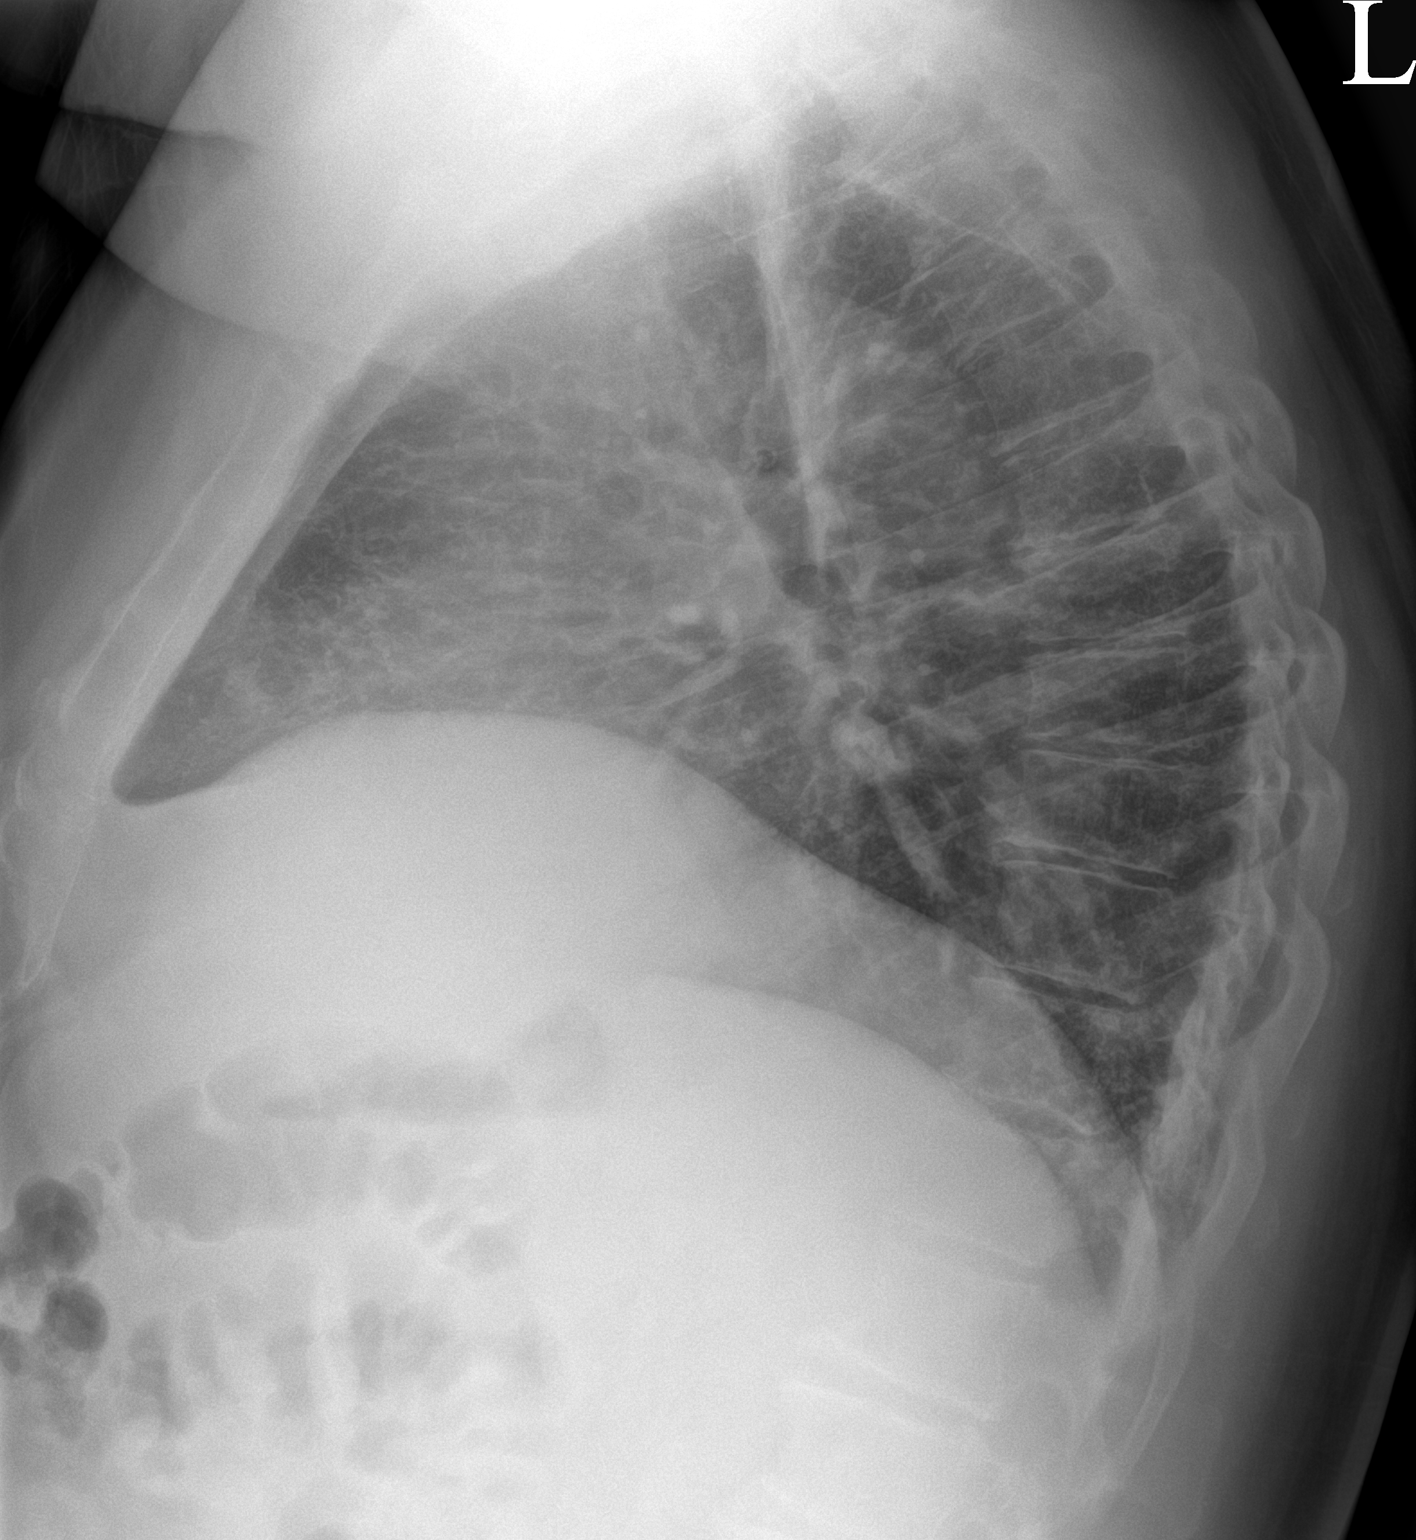

[2 of 2 positions shown; findings below may reference images not displayed]

FINDINGS: The mediastinal contours are within normal limits. No cardiomegaly.
The lungs are clear bilaterally without evidence of focal
consolidation, pleural effusion, or pneumothorax. No acute osseous
abnormality.
IMPRESSION: No acute cardiopulmonary process.
